# Patient Record
Sex: Male | Born: 2000 | Race: Black or African American | Hispanic: No | Marital: Single | Smoking: Never smoker
Health system: Southern US, Community
[De-identification: ages and names within clinical notes are randomized; demographics above are authoritative.]

## PROBLEM LIST (undated history)

## (undated) DIAGNOSIS — E785 Hyperlipidemia, unspecified: Secondary | ICD-10-CM

## (undated) HISTORY — DX: Hyperlipidemia, unspecified: E78.5

---
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
## 2022-05-29 ENCOUNTER — Emergency Department (HOSPITAL_COMMUNITY): Payer: Medicaid Other

## 2022-05-29 ENCOUNTER — Encounter (HOSPITAL_COMMUNITY): Payer: Self-pay

## 2022-05-29 ENCOUNTER — Emergency Department (HOSPITAL_COMMUNITY)
Admission: EM | Admit: 2022-05-29 | Discharge: 2022-05-30 | Disposition: A | Discharge status: Home / Self Care | Payer: Medicaid Other | Attending: Emergency Medicine | Admitting: Emergency Medicine

## 2022-05-29 ENCOUNTER — Other Ambulatory Visit: Payer: Self-pay

## 2022-05-29 DIAGNOSIS — S61012A Laceration without foreign body of left thumb without damage to nail, initial encounter: Secondary | ICD-10-CM | POA: Diagnosis not present

## 2022-05-29 DIAGNOSIS — W290XXA Contact with powered kitchen appliance, initial encounter: Secondary | ICD-10-CM | POA: Insufficient documentation

## 2022-05-29 DIAGNOSIS — S6992XA Unspecified injury of left wrist, hand and finger(s), initial encounter: Secondary | ICD-10-CM | POA: Diagnosis present

## 2022-05-29 NOTE — ED Provider Triage Note (Signed)
Emergency Medicine Provider Triage Evaluation Note  John Lee , a 22 y.o. male  was evaluated in triage.  Pt complains of left thumb laceration.  He was cleaning a blender just prior to arrival when his hand slipped and he cut a normal blade.  It was initially bleeding profusely but has been controlled with pressure.  Denies numbness or tingling.  Has full range of motion.  Review of Systems  Positive: As above Negative: As above  Physical Exam  BP (!) 136/103   Pulse 98   Temp 98.3 F (36.8 C)   Resp 15   SpO2 98%  Gen:   Awake, no distress   Resp:  Normal effort  MSK:   Moves extremities without difficulty  Other:  1 cm laceration to the pad of the left thumb.  Appears to be shallow.  Bleeding controlled  Medical Decision Making  Medically screening exam initiated at 11:46 PM.  Appropriate orders placed.  John Lee was informed that the remainder of the evaluation will be completed by another provider, this initial triage assessment does not replace that evaluation, and the importance of remaining in the ED until their evaluation is complete.  Laceration appears to be shallow, x-ray ordered   John Lee, John Lee 05/29/22 2346

## 2022-05-29 NOTE — ED Triage Notes (Signed)
~  1 cm laceration on left anterior thumb while cleaning blender blade.   Tetanus within last couple years from school.

## 2022-05-30 NOTE — Discharge Instructions (Signed)
please rinse out the wound and apply new dressings, do this twice daily.  May use over-the-counter pain medication as needed.      If you notice worsening redness swelling discharge or worsening pain in the area please come back in for reassessment

## 2022-05-30 NOTE — ED Provider Notes (Signed)
[facility] EM[facility]Y [facility]MENT AT [facility] Provider Note   CSN: [id number] Arrival date & time: 05/29/22  2334     History  Chief Complaint  Patient presents with   Laceration    John Lee is a 22 y.o. male.  HPI   Without significant medical history presenting with complaints of laceration to his left thumb.  He is left-hand dominant.  Patient states this happened while he was trying to clean out his blender, he states he is sliced the distal palmar aspect of his left thumb, states it was bleeding profusely which is why he came in.  He denies any paresthesia or weakness moving down his finger, states he is able to flex without difficulty.  He is not immunocompromise, states he is up-to-date on his tetanus shot.  He has no other complaints.    Home Medications Prior to Admission medications   Medication Sig Start Date End Date Taking? Authorizing Provider  albuterol (VENTOLIN HFA) 108 (90 Base) MCG/ACT inhaler Inhale 2 puffs into the lungs every 6 (six) hours as needed for wheezing or shortness of breath. Reported on 08/30/2015 Patient not taking: Reported on 08/04/2020 11/04/19   John Eagles, PA-C  cetirizine (ZYRTEC) 10 MG tablet Take 1 tablet (10 mg total) by mouth daily. Patient not taking: Reported on 08/04/2020 11/04/19   John Eagles, PA-C  EPINEPHrine 0.3 mg/0.3 mL IJ SOAJ injection INJECT 0.3 MILLILITER (0.3 MG) BY INTRAMUSCULAR ROUTE ONCE AS NEEDED FOR ANAPHYLAXIS Patient not taking: Reported on 08/04/2020 06/18/17   [provider]  FLOVENT HFA 110 MCG/ACT inhaler INHALE 2 PUFFS (220 MCG) BY INHALATION ROUTE 2 TIMES PER DAY FOR 365 DAYS Patient not taking: Reported on 08/04/2020 11/08/18   [provider]  omeprazole (PRILOSEC) 20 MG capsule Take 1 capsule (20 mg total) by mouth 2 (two) times daily before a meal. Patient not taking: No sig reported 11/11/18   John Basil, NP  omeprazole (PRILOSEC) 20 MG capsule Take 20 mg by mouth  daily. Patient not taking: Reported on 08/04/2020    [provider]  predniSONE (DELTASONE) 20 MG tablet Take 2 tablets daily with breakfast. Patient not taking: Reported on 08/04/2020 11/04/19   John Eagles, PA-C  beclomethasone (QVAR) 40 MCG/ACT inhaler Inhale 2 puffs into the lungs 2 (two) times daily. Reported on 08/30/2015  11/04/19  [provider]      Allergies    Eggs or egg-derived products and Shellfish allergy    Review of Systems   Review of Systems  Constitutional:  Negative for chills and fever.  Respiratory:  Negative for shortness of breath.   Cardiovascular:  Negative for chest pain.  Gastrointestinal:  Negative for abdominal pain.  Skin:  Positive for wound.  Neurological:  Negative for headaches.    Physical Exam Updated Vital Signs BP (!) 136/103   Pulse 98   Temp 98.3 F (36.8 C)   Resp 15   SpO2 98%  Physical Exam Vitals and nursing note reviewed.  Constitutional:      General: He is not in acute distress.    Appearance: Normal appearance. He is not ill-appearing or diaphoretic.  HENT:     Head: Normocephalic and atraumatic.     Nose: No congestion.  Eyes:     Conjunctiva/sclera: Conjunctivae normal.  Pulmonary:     Effort: Pulmonary effort is normal.  Musculoskeletal:     Cervical back: Neck supple.     Comments: Focused exam of the left thumb  reveals shows a V shaped laceration on the distal palmar aspect of the left thumb, superficial nature, clean borders, hemodynamically stable.  He is moving at all joints of his thumb, sensation intact to light touch, 2-second capillary refill.  Skin:    General: Skin is warm and dry.  Neurological:     Mental Status: He is alert.  Psychiatric:        Mood and Affect: Mood normal.     ED Results / Procedures / Treatments   Labs (all labs ordered are listed, but only abnormal results are displayed) Labs Reviewed - No data to display  EKG None  Radiology DG Finger Thumb  Left  Result Date: 05/29/2022 CLINICAL DATA:  Laceration. EXAM: LEFT THUMB 2+V COMPARISON:  None Available. FINDINGS: There is no evidence of fracture or dislocation. There is no evidence of arthropathy or other focal bone abnormality. Site of laceration is not well-defined by radiograph. No radiopaque foreign body. IMPRESSION: No osseous abnormality. Site of laceration not well-defined by radiograph. No radiopaque foreign body. Electronically Signed   By: Keith Rake M.D.   On: 05/29/2022 23:58    Procedures Procedures    Medications Ordered in ED Medications - No data to display  ED Course/ Medical Decision Making/ A&P                             Medical Decision Making  This patient presents to the ED for concern of laceration, this involves an extensive number of treatment options, and is a complaint that carries with it a high risk of complications and morbidity.  The differential diagnosis includes fracture, foreign body present, tendon damage    Additional history obtained:  Additional history obtained from friend at bedside External records from outside source obtained and reviewed including recent ER notes   Co morbidities that complicate the patient evaluation N/A  Social Determinants of Health:  N/A    Lab Tests:  I Ordered, and personally interpreted labs.  The pertinent results include: N/A    Imaging Studies ordered:  I ordered imaging studies including x-ray of thumb I independently visualized and interpreted imaging which showed negative for acute findings I agree with the radiologist interpretation   Cardiac Monitoring:  The patient was maintained on a cardiac monitor.  I personally viewed and interpreted the cardiac monitored which showed an underlying rhythm of: N/A   Medicines ordered and prescription drug management:  I ordered medication including N/A I have reviewed the patients home medicines and have made adjustments as  needed  Critical Interventions:  N/A   Reevaluation:  Presents with laceration, triage obtain imaging which I personally reviewed is unremarkable, patient had benign physical exam, agreement discharge at this time.  Consultations Obtained:  N/a    Test Considered:  Suture repair-shared decision making this will be deferred, find this agreeable as wound is very superficial nongaping clean borders, I do not feel it needs suturing at this time patient is also agreement with this.    Rule out Suspicion for tendon damage is low at this time as wound is very superficial, he has full range of motion in his joints of his left thumb without difficulty.  I doubt foreign body present as I did not note any on my exam x-rays negative for these findings.  I doubt fracture as imaging is negative    Dispostion and problem list  After consideration of the diagnostic results and the  patients response to treatment, I feel that the patent would benefit from discharge.  Laceration-will recommend basic wound care, defer on antibiotics as wound was thoroughly cleaned during my examination, noncontaminated, he is not immunocompromise.            Final Clinical Impression(s) / ED Diagnoses Final diagnoses:  Laceration of left thumb without foreign body without damage to nail, initial encounter    Rx / DC Orders ED Discharge Orders     None         Marcello Fennel, PA-C 05/30/22 0103    Quintella Reichert, MD 05/30/22 [phone]

## 2023-04-05 ENCOUNTER — Ambulatory Visit: Payer: Medicaid Other | Admitting: Internal Medicine

## 2023-04-12 ENCOUNTER — Emergency Department (HOSPITAL_COMMUNITY)
Admission: EM | Admit: 2023-04-12 | Discharge: 2023-04-12 | Disposition: A | Discharge status: Home / Self Care | Payer: Worker's Compensation | Attending: Emergency Medicine | Admitting: Emergency Medicine

## 2023-04-12 ENCOUNTER — Other Ambulatory Visit: Payer: Self-pay

## 2023-04-12 ENCOUNTER — Encounter (HOSPITAL_COMMUNITY): Payer: Self-pay

## 2023-04-12 DIAGNOSIS — W275XXA Contact with paper-cutter, initial encounter: Secondary | ICD-10-CM | POA: Diagnosis not present

## 2023-04-12 DIAGNOSIS — Y990 Civilian activity done for income or pay: Secondary | ICD-10-CM | POA: Diagnosis not present

## 2023-04-12 DIAGNOSIS — S61212A Laceration without foreign body of right middle finger without damage to nail, initial encounter: Secondary | ICD-10-CM | POA: Diagnosis present

## 2023-04-12 MED ORDER — LIDOCAINE-EPINEPHRINE-TETRACAINE (LET) TOPICAL GEL
3.0000 mL | Freq: Once | TOPICAL | Status: AC
  Administered 2023-04-12: 3 mL via TOPICAL
  Filled 2023-04-12: qty 3

## 2023-04-12 NOTE — ED Triage Notes (Signed)
Pt presenting to the ER with a laceration on his right middle finger. Pt was using a box cutter when it slipped and cut the end of his finger. Bleeding controlled in triage. Pt unsure of when his last tetanus shot was.

## 2023-04-12 NOTE — ED Provider Notes (Signed)
[place] EMERGENCY DEPARTMENT AT [facility] Provider Note   CSN: [id number] Arrival date & time: 04/12/23  1745     History  No chief complaint on file.   John Lee is a 22 y.o. male.  Otherwise healthy 22 year old male here today after he cut his right third finger with a box cutter at work.  Patient left hand-dominant.        Home Medications Prior to Admission medications   Medication Sig Start Date End Date Taking? Authorizing Provider  albuterol (VENTOLIN HFA) 108 (90 Base) MCG/ACT inhaler Inhale 2 puffs into the lungs every 6 (six) hours as needed for wheezing or shortness of breath. Reported on 08/30/2015 Patient not taking: Reported on 08/04/2020 11/04/19   Wallis Bamberg, PA-C  cetirizine (ZYRTEC) 10 MG tablet Take 1 tablet (10 mg total) by mouth daily. Patient not taking: Reported on 08/04/2020 11/04/19   Wallis Bamberg, PA-C  EPINEPHrine 0.3 mg/0.3 mL IJ SOAJ injection INJECT 0.3 MILLILITER (0.3 MG) BY INTRAMUSCULAR ROUTE ONCE AS NEEDED FOR ANAPHYLAXIS Patient not taking: Reported on 08/04/2020 06/18/17   [provider]  FLOVENT HFA 110 MCG/ACT inhaler INHALE 2 PUFFS (220 MCG) BY INHALATION ROUTE 2 TIMES PER DAY FOR 365 DAYS Patient not taking: Reported on 08/04/2020 11/08/18   [provider]  omeprazole (PRILOSEC) 20 MG capsule Take 1 capsule (20 mg total) by mouth 2 (two) times daily before a meal. Patient not taking: No sig reported 11/11/18   Lorin Picket, NP  omeprazole (PRILOSEC) 20 MG capsule Take 20 mg by mouth daily. Patient not taking: Reported on 08/04/2020    [provider]  predniSONE (DELTASONE) 20 MG tablet Take 2 tablets daily with breakfast. Patient not taking: Reported on 08/04/2020 11/04/19   Wallis Bamberg, PA-C  beclomethasone (QVAR) 40 MCG/ACT inhaler Inhale 2 puffs into the lungs 2 (two) times daily. Reported on 08/30/2015  11/04/19  [provider]      Allergies    Egg-derived products and Shellfish  allergy    Review of Systems   Review of Systems  Physical Exam Updated Vital Signs BP 126/69 (BP Location: Left Arm)   Pulse (!) 58   Temp 97.8 F (36.6 C) (Oral)   Resp 15   SpO2 99%  Physical Exam Skin:    Comments: 2.5 cm laceration over the medial aspect of the right third finger pad.  Active bleeding, no significant depth.  Intact FDS and FDP function.  No bone visible.     ED Results / Procedures / Treatments   Labs (all labs ordered are listed, but only abnormal results are displayed) Labs Reviewed - No data to display  EKG None  Radiology No results found.  Procedures .Laceration Repair  Date/Time: 04/12/2023 7:10 PM  Performed by: Arletha Pili, DO Authorized by: Anders Simmonds T, DO   Laceration details:    Length (cm):  2.5 Comments:     Area anesthetized using let gel.  Patient washed the area in the sink with soap and water.  3 simple interrupted sutures using 5-0 Vicryl Rapide were placed.  Hemostasis achieved.  Patient tolerated procedure well.     Medications Ordered in ED Medications  lidocaine-EPINEPHrine-tetracaine (LET) topical gel (3 mLs Topical Given 04/12/23 1811)    ED Course/ Medical Decision Making/ A&P  Medical Decision Making 51-year-old male here today with finger laceration.  Plan- patient up to date on tetanus.  Anesthetized with let.  See procedure note for repair details.  Considered imaging on the patient, however this appears to be a superficial wound.  No indication for antibiotics.  Will discharge.           Final Clinical Impression(s) / ED Diagnoses Final diagnoses:  None    Rx / DC Orders ED Discharge Orders     None         Arletha Pili, DO 04/12/23 1911

## 2023-04-12 NOTE — Discharge Instructions (Signed)
The stitches that I put in are dissolvable.  They should fall out on their own in about 1 week to 10 days.  Make sure that they stay in for at least 5 days.  This evening, leave the dressing that we put on alone.  Tomorrow he can gently take the dressing down and wash the area with soap and water.  You can apply an antibiotic ointment.

## 2023-05-03 ENCOUNTER — Encounter: Payer: Self-pay | Admitting: Internal Medicine

## 2023-05-03 ENCOUNTER — Ambulatory Visit (INDEPENDENT_AMBULATORY_CARE_PROVIDER_SITE_OTHER): Payer: Medicaid Other | Admitting: Internal Medicine

## 2023-05-03 ENCOUNTER — Other Ambulatory Visit: Payer: Self-pay

## 2023-05-03 VITALS — BP 108/64 | HR 66 | Temp 98.4°F | Ht 71.3 in | Wt 213.1 lb

## 2023-05-03 DIAGNOSIS — J301 Allergic rhinitis due to pollen: Secondary | ICD-10-CM | POA: Diagnosis not present

## 2023-05-03 DIAGNOSIS — J3089 Other allergic rhinitis: Secondary | ICD-10-CM

## 2023-05-03 DIAGNOSIS — L272 Dermatitis due to ingested food: Secondary | ICD-10-CM | POA: Diagnosis not present

## 2023-05-03 DIAGNOSIS — J3081 Allergic rhinitis due to animal (cat) (dog) hair and dander: Secondary | ICD-10-CM

## 2023-05-03 DIAGNOSIS — J343 Hypertrophy of nasal turbinates: Secondary | ICD-10-CM

## 2023-05-03 MED ORDER — AZELASTINE HCL 0.1 % NA SOLN: 2.0000 | Freq: Two times a day (BID) | NASAL | 5 refills | Status: DC | PRN

## 2023-05-03 MED ORDER — CETIRIZINE HCL 5 MG/5ML PO SOLN: 10.0000 mg | Freq: Every day | ORAL | 5 refills | Status: DC

## 2023-05-03 MED ORDER — FLUTICASONE PROPIONATE 50 MCG/ACT NA SUSP: 2.0000 | Freq: Every day | NASAL | 5 refills | Status: DC

## 2023-05-03 NOTE — Progress Notes (Signed)
 NEW PATIENT  Date of Service/Encounter:  05/03/23  Consult requested by: Coccaro, Peter J, MD   Subjective:   John Lee (DOB: [birth year]) is a 23 y.o. male who presents to the clinic on 05/03/2023 with a chief complaint of Allergic Rhinitis , Establish Care, Cough, and Food allergies (Eggs, Shellfish and Chestnut) .    History obtained from: chart review and patient.  Asthma Hx: Symptoms mostly when younger.  Not much issues with SOB/wheezing.  He is active.  No albuterol  use/ER visits/oral prednisone  in over 4 years.   Rhinitis:  Started since he was young.  Symptoms include: nasal congestion, rhinorrhea, post nasal drainage, and sneezing, post nasal drip with wet cough   Occurs seasonally-Spring/Fall   Potential triggers: pollens  Treatments tried:  PRN anti histamines. Has trouble with swallowing pills and trouble with its textures.    Previous allergy  testing: yes last in 2020 History of sinus surgery: yes; 2020 turbinate reduction with Dr Carlie Meeter ENT  Nonallergic triggers: none    Concern for Food Allergy :  Foods of concern: eggs, chestnuts, shellfish   History of reaction:  Eggs was told allergic to them but he eats fried chicken, cakes, pancakes, fried rice with eggs, mayo.    Shellfish-kebab crabs around age 85, maybe rash but never had anaphylaxis.    Chestnuts- never tried, was told allergic   Previous allergy  testing yes in 2020.  12/2018: sIgE negative to fish/eggs, positive to shellfish.   Carries an epinephrine  autoinjector: no   Reviewed:  03/29/2020: seen in urgent care for cough, sore throat, congestion. COVID/Flu negative. Discussed use of Azelastine .   11/04/2019: seen in urgent care for SOB, sinus congestion, throat congestion.  Difficult to control allergies.  Also hx of asthma.  No wheezing on exam.  Started on oral prednisone  to help with allergies   01/14/2019: seen by Dr Jeneal Allergy  for allergic rhinitis, food allergies, mild  asthma.  Discussed as he has been asymptomatic, likely outgrown asthma.  For foods, avoiding fish, shellfish, stove top eggs. For allergies Zyrtec  PRN.  12/2018: aeroallergen panel shows reactivity to grasses, cockroach, trees, weeds. sIgE negative to fish/eggs, positive to shellfish.   01/02/2019: seen by [facility] ENT Dr Carlie for nasal turbinate hypertrophy s/p resection 11/2018.  Past Medical History: Past Medical History:  Diagnosis Date   Asthma    Hyperlipidemia    Past Surgical History: Past Surgical History:  Procedure Laterality Date   NASAL TURBINATE REDUCTION  12/16/2018    Family History: Family History  Problem Relation Age of Onset   Asthma Mother    Diabetes Maternal Grandmother    Hypertension Maternal Grandmother    Hyperlipidemia Maternal Grandmother    Asthma Other    Diabetes Other    Hyperlipidemia Other    Hypertension Other    Thyroid disease Other     Social History:  Pets: cat  Medication List:  Allergies as of 05/03/2023       Reactions   Egg-derived Products Other (See Comments)   I've just been told I am   Shellfish Allergy          Medication List        Accurate as of May 03, 2023 11:08 AM. If you have any questions, ask your nurse or doctor.          STOP taking these medications    cetirizine  10 MG tablet Commonly known as: ZYRTEC  Replaced by: cetirizine  HCl 5 MG/5ML Soln Stopped by: Arleta SQUIBB  Benford Asch       TAKE these medications    albuterol  108 (90 Base) MCG/ACT inhaler Commonly known as: VENTOLIN  HFA Inhale 2 puffs into the lungs every 6 (six) hours as needed for wheezing or shortness of breath. Reported on 08/30/2015   azelastine  0.1 % nasal spray Commonly known as: ASTELIN  Place 2 sprays into both nostrils 2 (two) times daily as needed for allergies or rhinitis. Use in each nostril as directed Started by: Arleta SHAUNNA Blanch   cetirizine  HCl 5 MG/5ML Soln Commonly known as: Zyrtec  Take 10 mLs (10 mg total) by mouth  daily. Replaces: cetirizine  10 MG tablet Started by: Arleta SHAUNNA Blanch   EPINEPHrine  0.3 mg/0.3 mL Soaj injection Commonly known as: EPI-PEN INJECT 0.3 MILLILITER (0.3 MG) BY INTRAMUSCULAR ROUTE ONCE AS NEEDED FOR ANAPHYLAXIS   Flovent  HFA 110 MCG/ACT inhaler Generic drug: fluticasone  INHALE 2 PUFFS (220 MCG) BY INHALATION ROUTE 2 TIMES PER DAY FOR 365 DAYS   fluticasone  50 MCG/ACT nasal spray Commonly known as: FLONASE  Place 2 sprays into both nostrils daily. Started by: Arleta SHAUNNA Blanch   omeprazole  20 MG capsule Commonly known as: PRILOSEC Take 20 mg by mouth daily.   omeprazole  20 MG capsule Commonly known as: PRILOSEC Take 1 capsule (20 mg total) by mouth 2 (two) times daily before a meal.   predniSONE  20 MG tablet Commonly known as: DELTASONE  Take 2 tablets daily with breakfast.         REVIEW OF SYSTEMS: Pertinent positives and negatives discussed in HPI.   Objective:   Physical Exam: BP 108/64 (BP Location: Left Arm, Patient Position: Sitting, Cuff Size: Normal)   Pulse 66   Temp 98.4 F (36.9 C) (Temporal)   Ht 5' 11.3 (1.811 m)   Wt 213 lb 1.6 oz (96.7 kg)   SpO2 98%   BMI 29.47 kg/m  Body mass index is 29.47 kg/m. GEN: alert, well developed HEENT: clear conjunctiva, nose with + moderate inferior turbinate hypertrophy, pink nasal mucosa, + clear rhinorrhea, + cobblestoning HEART: regular rate and rhythm, no murmur LUNGS: clear to auscultation bilaterally, no coughing, unlabored respiration ABDOMEN: soft, non distended  SKIN: no rashes or lesions  Skin Testing:  Skin prick testing was placed, which includes aeroallergens/foods, histamine control, and saline control.  Verbal consent was obtained prior to placing test.  Patient tolerated procedure well.  Allergy  testing results were read and interpreted by myself, documented by clinical staff. Adequate positive and negative control.  Results discussed with patient/family.  Airborne Adult Perc - 05/03/23  0956     Time Antigen Placed 9043    Allergen Manufacturer Jestine    Location Back    Number of Test 55    1. Control-Buffer 50% Glycerol Negative    2. Control-Histamine 3+    3. Bahia 3+    4. Bermuda 3+    5. Johnson 3+    6. [place]  Blue 3+    7. Meadow Fescue 3+    8. Perennial Rye 3+    9. Timothy 3+    10. Ragweed Mix Negative    11. Cocklebur Negative    12. Plantain,  English Negative    13. Baccharis Negative    14. Dog Fennel Negative    15. Russian Thistle Negative    16. Lamb's Quarters Negative    17. Sheep Sorrell Negative    18. Rough Pigweed Negative    19. Marsh Elder, Rough Negative    20. Mugwort, Common Negative  21. Box, Elder Negative    22. Cedar, red Negative    23. Sweet Gum Negative    24. Pecan Pollen 3+    25. Pine Mix 2+    26. Walnut, Black Pollen 3+    27. Red Mulberry Negative    28. Ash Mix Negative    29. Birch Mix Negative    30. Beech American Negative    31. Cottonwood, Eastern Negative    32. Hickory, White 3+    33. Maple Mix Negative    34. Oak, Eastern Mix 2+    35. Sycamore Eastern Negative    36. Alternaria Alternata Negative    37. Cladosporium Herbarum Negative    38. Aspergillus Mix Negative    39. Penicillium Mix Negative    40. Bipolaris Sorokiniana (Helminthosporium) Negative    41. Drechslera Spicifera (Curvularia) Negative    42. Mucor Plumbeus Negative    43. Fusarium Moniliforme Negative    44. Aureobasidium Pullulans (pullulara) Negative    45. Rhizopus Oryzae Negative    46. Botrytis Cinera Negative    47. Epicoccum Nigrum Negative    48. Phoma Betae Negative    49. Dust Mite Mix Negative    50. Cat Hair 10,000 BAU/ml Negative    51.  Dog Epithelia Negative    52. Mixed Feathers Negative    53. Horse Epithelia Negative    54. Cockroach, German Negative    55. Tobacco Leaf Negative             Intradermal - 05/03/23 1024     Time Antigen Placed 1029    Allergen Manufacturer Greer     Location Arm    Number of Test 11    Control Negative    Ragweed Mix 3+    Weed Mix 3+    Mold 1 Negative    Mold 2 Negative    Mold 3 2+    Mold 4 3+    Mite Mix 3+    Cat 2+    Dog Negative    Cockroach 3+             Food Adult Perc - 05/03/23 0900     Time Antigen Placed 9044    Allergen Manufacturer Jestine    Location Back    Number of allergen test 9    7. Egg White, Chicken Negative    8. Shellfish Mix Negative    23. Shrimp Negative    24. Crab Negative    25. Lobster Negative    26. Oyster Negative    27. Scallops Negative               Assessment:   1. Nasal turbinate hypertrophy   2. Dermatitis due to ingested food   3. Seasonal allergic rhinitis due to pollen   4. Allergic rhinitis caused by mold   5. Allergic rhinitis due to animal hair or dander   6. Allergic rhinitis due to dust mite   7. Allergic rhinitis due to insect     Plan/Recommendations:  Allergic Rhinitis: - Due to turbinate hypertrophy, seasonal symptoms and unresponsive to over the counter meds, will perform skin testing to identify aeroallergen triggers.   - Positive skin test 04/2023: trees, grasses, weeds, molds, dust mites, cats, cockroach  - Avoidance measures discussed. - Use nasal saline rinses before nose sprays such as with Neilmed Sinus Rinse.  Use distilled water.   - Use Flonase  2 sprays each nostril daily. Aim upward  and outward. - Use Azelastine  1-2 sprays each nostril twice daily as needed for runny nose, drainage, sneezing, congestion. Aim upward and outward. - Use Zyrtec  10 mg daily.  - Consider allergy  shots as long term control of your symptoms by teaching your immune system to be more tolerant of your allergy  triggers  Food Reactions - SPT 04/2023: negative to eggs and shellfish. - Seems like you are already eating eggs without any reaction so continue doing so.  - In terms of shellfish, negative skin testing so would recommend reintroduction.  - In terms of  chestnuts, we do not have testing available for this.  However, food allergies are defined by history of anaphylaxis which you do not recall.  Therefore, low suspicion for true allergy .    - Can consider at home reintroduction or in office challenge for the shellfish/chestnuts.    History of Asthma - Likely outgrown, no albuterol /prednisone  use/symptoms in over 4 years.    ALLERGEN AVOIDANCE MEASURES   Dust Mites Use central air conditioning and heat; and change the filter monthly.  Pleated filters work better than mesh filters.  Electrostatic filters may also be used; wash the filter monthly.  Window air conditioners may be used, but do not clean the air as well as a central air conditioner.  Change or wash the filter monthly. Keep windows closed.  Do not use attic fans.   Encase the mattress, box springs and pillows with zippered, dust proof covers. Wash the bed linens in hot water weekly.   Remove carpet, especially from the bedroom. Remove stuffed animals, throw pillows, dust ruffles, heavy drapes and other items that collect dust from the bedroom. Do not use a humidifier.   Use wood, vinyl or leather furniture instead of cloth furniture in the bedroom. Keep the indoor humidity at 30 - 40%.    Molds - Indoor avoidance Use air conditioning to reduce indoor humidity.  Do not use a humidifier. Keep indoor humidity at 30 - 40%.  Use a dehumidifier if needed. In the bathroom use an exhaust fan or open a window after showering.  Wipe down damp surfaces after showering.  Clean bathrooms with a mold-killing solution (diluted bleach, or products like Tilex, etc) at least once a month. In the kitchen use an exhaust fan to remove steam from cooking.  Throw away spoiled foods immediately, and empty garbage daily.  Empty water pans below self-defrosting refrigerators frequently. Vent the clothes dryer to the outside. Limit indoor houseplants; mold grows in the dirt.  No houseplants in the  bedroom. Remove carpet from the bedroom. Encase the mattress and box springs with a zippered encasing.  Molds - Outdoor avoidance Avoid being outside when the grass is being mowed, or the ground is tilled. Avoid playing in leaves, pine straw, hay, etc.  Dead plant materials contain mold. Avoid going into barns or grain storage areas. Remove leaves, clippings and compost from around the home.  Cockroach Limit spread of food around the house; especially keep food out of bedrooms. Keep food and garbage in closed containers with a tight lid.  Never leave food out in the kitchen.  Do not leave out pet food or dirty food bowls. Mop the kitchen floor and wash countertops at least once a week. Repair leaky pipes and faucets so there is no standing water to attract roaches. Plug up cracks in the house through which cockroaches can enter. Use bait stations and approved pesticides to reduce cockroach infestation. Pollen Avoidance Pollen levels are  highest during the mid-day and afternoon.  Consider this when planning outdoor activities. Avoid being outside when the grass is being mowed, or wear a mask if the pollen-allergic person must be the one to mow the grass. Keep the windows closed to keep pollen outside of the home. Use an air conditioner to filter the air. Take a shower, wash hair, and change clothing after working or playing outdoors during pollen season. Pet Dander- Cats Keep the pet out of your bedroom and restrict it to only a few rooms. Be advised that keeping the pet in only one room will not limit the allergens to that room. Don't pet, hug or kiss the pet; if you do, wash your hands with soap and water. High-efficiency particulate air (HEPA) cleaners run continuously in a bedroom or living room can reduce allergen levels over time. Regular use of a high-efficiency vacuum cleaner or a central vacuum can reduce allergen levels. Giving your pet a bath at least once a week can reduce  airborne allergen.    Return in about 2 months (around 07/01/2023).  Arleta Blanch, MD [facility] 

## 2023-05-03 NOTE — Patient Instructions (Addendum)
 Allergic Rhinitis: - Positive skin test 04/2023: trees, grasses, weeds, molds, dust mites, cats, cockroach  - Avoidance measures discussed. - Use nasal saline rinses before nose sprays such as with Neilmed Sinus Rinse.  Use distilled water.   - Use Flonase  2 sprays each nostril daily. Aim upward and outward. - Use Azelastine  1-2 sprays each nostril twice daily as needed for runny nose, drainage, sneezing, congestion. Aim upward and outward. - Use Zyrtec  10 mg daily. Prefers liquid medications.  - Consider allergy  shots as long term control of your symptoms by teaching your immune system to be more tolerant of your allergy  triggers  Food Reactions - SPT 04/2023: negative to eggs and shellfish. - Seems like you are already eating eggs without any reaction so continue doing so.  - In terms of shellfish, negative skin testing so would recommend reintroduction.  - In terms of chestnuts, we do not have testing available for this.  However, food allergies are defined by history of anaphylaxis which you do not recall.  Therefore, low suspicion for true allergy .    - Can consider at home reintroduction or in office challenge for the shellfish/chestnuts.    History of Asthma - Likely outgrown, no albuterol /prednisone  use/symptoms in over 4 years.    ALLERGEN AVOIDANCE MEASURES   Dust Mites Use central air conditioning and heat; and change the filter monthly.  Pleated filters work better than mesh filters.  Electrostatic filters may also be used; wash the filter monthly.  Window air conditioners may be used, but do not clean the air as well as a central air conditioner.  Change or wash the filter monthly. Keep windows closed.  Do not use attic fans.   Encase the mattress, box springs and pillows with zippered, dust proof covers. Wash the bed linens in hot water weekly.   Remove carpet, especially from the bedroom. Remove stuffed animals, throw pillows, dust ruffles, heavy drapes and other items that  collect dust from the bedroom. Do not use a humidifier.   Use wood, vinyl or leather furniture instead of cloth furniture in the bedroom. Keep the indoor humidity at 30 - 40%.    Molds - Indoor avoidance Use air conditioning to reduce indoor humidity.  Do not use a humidifier. Keep indoor humidity at 30 - 40%.  Use a dehumidifier if needed. In the bathroom use an exhaust fan or open a window after showering.  Wipe down damp surfaces after showering.  Clean bathrooms with a mold-killing solution (diluted bleach, or products like Tilex, etc) at least once a month. In the kitchen use an exhaust fan to remove steam from cooking.  Throw away spoiled foods immediately, and empty garbage daily.  Empty water pans below self-defrosting refrigerators frequently. Vent the clothes dryer to the outside. Limit indoor houseplants; mold grows in the dirt.  No houseplants in the bedroom. Remove carpet from the bedroom. Encase the mattress and box springs with a zippered encasing.  Molds - Outdoor avoidance Avoid being outside when the grass is being mowed, or the ground is tilled. Avoid playing in leaves, pine straw, hay, etc.  Dead plant materials contain mold. Avoid going into barns or grain storage areas. Remove leaves, clippings and compost from around the home.  Cockroach Limit spread of food around the house; especially keep food out of bedrooms. Keep food and garbage in closed containers with a tight lid.  Never leave food out in the kitchen.  Do not leave out pet food or dirty food bowls.  Mop the kitchen floor and wash countertops at least once a week. Repair leaky pipes and faucets so there is no standing water to attract roaches. Plug up cracks in the house through which cockroaches can enter. Use bait stations and approved pesticides to reduce cockroach infestation. Pollen Avoidance Pollen levels are highest during the mid-day and afternoon.  Consider this when planning outdoor  activities. Avoid being outside when the grass is being mowed, or wear a mask if the pollen-allergic person must be the one to mow the grass. Keep the windows closed to keep pollen outside of the home. Use an air conditioner to filter the air. Take a shower, wash hair, and change clothing after working or playing outdoors during pollen season. Pet Dander- Cats Keep the pet out of your bedroom and restrict it to only a few rooms. Be advised that keeping the pet in only one room will not limit the allergens to that room. Don't pet, hug or kiss the pet; if you do, wash your hands with soap and water. High-efficiency particulate air (HEPA) cleaners run continuously in a bedroom or living room can reduce allergen levels over time. Regular use of a high-efficiency vacuum cleaner or a central vacuum can reduce allergen levels. Giving your pet a bath at least once a week can reduce airborne allergen.

## 2023-07-03 ENCOUNTER — Ambulatory Visit (INDEPENDENT_AMBULATORY_CARE_PROVIDER_SITE_OTHER): Payer: Medicaid Other | Admitting: Internal Medicine

## 2023-07-03 ENCOUNTER — Encounter: Payer: Self-pay | Admitting: Internal Medicine

## 2023-07-03 ENCOUNTER — Other Ambulatory Visit: Payer: Self-pay

## 2023-07-03 VITALS — BP 118/68 | HR 64 | Temp 98.6°F | Wt 206.9 lb

## 2023-07-03 DIAGNOSIS — J302 Other seasonal allergic rhinitis: Secondary | ICD-10-CM | POA: Diagnosis not present

## 2023-07-03 DIAGNOSIS — J3089 Other allergic rhinitis: Secondary | ICD-10-CM | POA: Diagnosis not present

## 2023-07-03 MED ORDER — AZELASTINE HCL 0.1 % NA SOLN: 2.0000 | Freq: Two times a day (BID) | NASAL | 5 refills | Status: AC | PRN

## 2023-07-03 MED ORDER — FLUTICASONE PROPIONATE 50 MCG/ACT NA SUSP: 2.0000 | Freq: Every day | NASAL | 5 refills | Status: AC

## 2023-07-03 MED ORDER — CETIRIZINE HCL 5 MG/5ML PO SOLN: 10.0000 mg | Freq: Every day | ORAL | 5 refills | Status: AC

## 2023-07-03 NOTE — Patient Instructions (Addendum)
 Allergic Rhinitis: - Positive skin test 04/2023: trees, grasses, weeds, molds, dust mites, cats, cockroach  - Use nasal saline rinses before nose sprays such as with Neilmed Sinus Rinse.  Use distilled water.   - Use Flonase 2 sprays each nostril daily. Aim upward and outward. - Use Azelastine 2 sprays each nostril twice daily as needed for runny nose, drainage, sneezing, congestion. Aim upward and outward. - Use Zyrtec 10 mg daily as needed for runny nose, sneezing, itchy watery eyes. Prefers liquid medications.  - Consider allergy shots as long term control of your symptoms by teaching your immune system to be more tolerant of your allergy triggers.

## 2023-07-03 NOTE — Progress Notes (Signed)
   FOLLOW UP Date of Service/Encounter:  07/03/23   Subjective:  John Lee (DOB: 2000-12-01) is a 23 y.o. male who [facility] follow up for allergic rhinitis.   History obtained from: chart review and patient. Last visit was with me on 05/03/2023 for food reactions with negative testing to eggs and shellfish, discussed reintroduction and eating as tolerated. In terms of allergic rhinitis, started on Flonase, PRN azelastine and PRN Zyrtec.    Doing well since last visit. Had a few weeks in Winter with increased rhinorrhea and congestion but other than that denies issues with allergies.  Using Flonase daily, rarely Zyrtec or Azelastine.   Past Medical History: Past Medical History:  Diagnosis Date   Asthma    Hyperlipidemia     Objective:  BP 118/68 (BP Location: Left Arm, Patient Position: Sitting, Cuff Size: Normal)   Pulse 64   Temp 98.6 F (37 C) (Temporal)   Wt 206 lb 14.4 oz (93.8 kg)   SpO2 98%   BMI 28.62 kg/m  Body mass index is 28.62 kg/m. Physical Exam: GEN: alert, well developed HEENT: clear conjunctiva, nose with mild inferior turbinate hypertrophy, pink nasal mucosa, clear rhinorrhea, + cobblestoning HEART: regular rate and rhythm, no murmur LUNGS: clear to auscultation bilaterally, no coughing, unlabored respiration SKIN: no rashes or lesions  Assessment:   1. Seasonal and perennial allergic rhinitis     Plan/Recommendations:  Allergic Rhinitis: - Controlled  - Positive skin test 04/2023: trees, grasses, weeds, molds, dust mites, cats, cockroach  - Use nasal saline rinses before nose sprays such as with Neilmed Sinus Rinse.  Use distilled water.   - Use Flonase 2 sprays each nostril daily. Aim upward and outward. - Use Azelastine 2 sprays each nostril twice daily as needed for runny nose, drainage, sneezing, congestion. Aim upward and outward. - Use Zyrtec 10 mg daily as needed for runny nose, sneezing,  itchy watery eyes. 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   - Consider allergy shots as long term control of your symptoms by teaching your immune system to be more tolerant of your allergy triggers.      Return in about 6 months (around 01/03/2024).  Alesia Morin, MD [facility]

## 2024-01-08 ENCOUNTER — Ambulatory Visit: Admitting: Internal Medicine
